# Patient Record
Sex: Female | Born: 2000 | Race: Black or African American | Hispanic: No | Marital: Single | Smoking: Never smoker
Health system: Southern US, Community
[De-identification: ages and names within clinical notes are randomized; demographics above are authoritative.]

## PROBLEM LIST (undated history)

## (undated) HISTORY — PX: HERNIA REPAIR: SHX51

---
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
## 2022-09-03 ENCOUNTER — Encounter (HOSPITAL_BASED_OUTPATIENT_CLINIC_OR_DEPARTMENT_OTHER): Payer: Self-pay

## 2022-09-03 ENCOUNTER — Other Ambulatory Visit: Payer: Self-pay

## 2022-09-03 ENCOUNTER — Emergency Department (HOSPITAL_BASED_OUTPATIENT_CLINIC_OR_DEPARTMENT_OTHER): Payer: BC Managed Care – PPO

## 2022-09-03 ENCOUNTER — Emergency Department (HOSPITAL_BASED_OUTPATIENT_CLINIC_OR_DEPARTMENT_OTHER)
Admission: EM | Admit: 2022-09-03 | Discharge: 2022-09-04 | Disposition: A | Discharge status: Home / Self Care | Payer: BC Managed Care – PPO | Attending: Emergency Medicine | Admitting: Emergency Medicine

## 2022-09-03 DIAGNOSIS — I2510 Atherosclerotic heart disease of native coronary artery without angina pectoris: Secondary | ICD-10-CM | POA: Insufficient documentation

## 2022-09-03 DIAGNOSIS — R079 Chest pain, unspecified: Secondary | ICD-10-CM | POA: Insufficient documentation

## 2022-09-03 LAB — BASIC METABOLIC PANEL
Anion gap: 8 (ref 5–15)
BUN: 12 mg/dL (ref 6–20)
CO2: 22 mmol/L (ref 22–32)
Calcium: 8.8 mg/dL — ABNORMAL LOW (ref 8.9–10.3)
Chloride: 104 mmol/L (ref 98–111)
Creatinine, Ser: 0.88 mg/dL (ref 0.44–1.00)
GFR, Estimated: >60 mL/min (ref >60)
Glucose, Bld: 100 mg/dL — ABNORMAL HIGH (ref 70–99)
Potassium: 3.7 mmol/L (ref 3.5–5.1)
Sodium: 134 mmol/L — ABNORMAL LOW (ref 135–145)

## 2022-09-03 LAB — CBC WITH DIFFERENTIAL/PLATELET
Abs Immature Granulocytes: 0.02 10*3/uL (ref 0.00–0.07)
Basophils Absolute: 0.1 10*3/uL (ref 0.0–0.1)
Basophils Relative: 1 %
Eosinophils Absolute: 0 10*3/uL (ref 0.0–0.5)
Eosinophils Relative: 0 %
HCT: 29.2 % — ABNORMAL LOW (ref 36.0–46.0)
Hemoglobin: 8.5 g/dL — ABNORMAL LOW (ref 12.0–15.0)
Immature Granulocytes: 0 %
Lymphocytes Relative: 48 %
Lymphs Abs: 4 10*3/uL (ref 0.7–4.0)
MCH: 18.5 pg — ABNORMAL LOW (ref 26.0–34.0)
MCHC: 29.1 g/dL — ABNORMAL LOW (ref 30.0–36.0)
MCV: 63.6 fL — ABNORMAL LOW (ref 80.0–100.0)
Monocytes Absolute: 0.5 10*3/uL (ref 0.1–1.0)
Monocytes Relative: 6 %
Neutro Abs: 3.8 10*3/uL (ref 1.7–7.7)
Neutrophils Relative %: 45 %
Platelets: 257 10*3/uL (ref 150–400)
RBC: 4.59 MIL/uL (ref 3.87–5.11)
RDW: 19.7 % — ABNORMAL HIGH (ref 11.5–15.5)
WBC: 8.5 10*3/uL (ref 4.0–10.5)
nRBC: 0 % (ref 0.0–0.2)

## 2022-09-03 LAB — TROPONIN I (HIGH SENSITIVITY): Troponin I (High Sensitivity): <2 ng/L (ref <18)

## 2022-09-03 NOTE — Discharge Instructions (Signed)
It was a pleasure caring for you today in the emergency department.  Please return to the emergency department for any worsening or worrisome symptoms.  Return to the Emergency Department if you have unusual chest pain, pressure, or discomfort, shortness of breath, nausea, vomiting, burping, heartburn, tingling upper body parts, sweating, cold, clammy skin, or racing heartbeat. Call 911 if you think you are having a heart attack.  Follow cardiac diet - avoid fatty & fried foods, don't eat too much red meat, eat lots of fruits & vegetables, and dairy products should be low fat. Please lose weight if you are overweight. Become more active with walking, gardening, or any other activity that gets you to moving.   Please return to the emergency department immediately for any new or concerning symptoms, or if you get worse.  

## 2022-09-03 NOTE — ED Provider Notes (Signed)
[facility] Provider Note  CSN: [id number] Arrival date & time: 09/03/22 2210  Chief Complaint(s) Chest Pain  HPI Kristina Porter is a 22 y.o. female with past medical history as below, significant for no significant medical history who presents to the ED with complaint of chest pain.  Patient reports has been having intermittent chest tightness over the past few months, intermittent paroxysms lasting approximate 2 to 5 minutes.  Unclear provoking factors.  Last episode was yesterday and resolved spontaneously.  She was seen by her pediatrician for this problem in the past and advised to go to the ER if symptoms worsen.  Denies any diet or medication changes, no illicit drug use reported.  No significant dyspnea.  No history of cardiac abnormalities in the past, no family history of sudden cardiac death, no first-degree relatives with MI prior to age 34.  No recent travel or sick contacts.  No leg swelling, no oral hormone use, no history of DVT  Last episode of chest pain around 1900  She is currently asymptomatic   Past Medical History History reviewed. No pertinent past medical history. There are no problems to display for this patient.  Home Medication(s) Prior to Admission medications   Not on File                                                                                                                                    Past Surgical History Past Surgical History:  Procedure Laterality Date   HERNIA REPAIR     Family History History reviewed. No pertinent family history.  Social History Social History   Tobacco Use   Smoking status: Never   Allergies Patient has no known allergies.  Review of Systems Review of Systems  Constitutional:  Negative for chills and fever.  HENT:  Negative for facial swelling and trouble swallowing.   Eyes:  Negative for photophobia and visual disturbance.  Respiratory:  Negative for cough  and shortness of breath.   Cardiovascular:  Positive for chest pain. Negative for palpitations.  Gastrointestinal:  Negative for abdominal pain, nausea and vomiting.  Endocrine: Negative for polydipsia and polyuria.  Genitourinary:  Negative for difficulty urinating and hematuria.  Musculoskeletal:  Negative for gait problem and joint swelling.  Skin:  Negative for pallor and rash.  Neurological:  Negative for syncope and headaches.  Psychiatric/Behavioral:  Negative for agitation and confusion.     Physical Exam Vital Signs  I have reviewed the triage vital signs BP (!) 150/66 (BP Location: Left Arm)   Pulse 100   Temp 99 F (37.2 C)   Resp 20   Ht 5\' 6"  (1.676 m)   Wt 117.9 kg   LMP 08/07/2022 (Approximate)   SpO2 100%   BMI 41.97 kg/m  Physical Exam Vitals and nursing note reviewed.  Constitutional:      General: She is not in acute distress.  Appearance: Normal appearance. She is well-developed. She is obese.  HENT:     Head: Normocephalic and atraumatic.     Right Ear: External ear normal.     Left Ear: External ear normal.     Nose: Nose normal.     Mouth/Throat:     Mouth: Mucous membranes are moist.  Eyes:     General: No scleral icterus.       Right eye: No discharge.        Left eye: No discharge.  Cardiovascular:     Rate and Rhythm: Normal rate and regular rhythm.     Pulses: Normal pulses.     Heart sounds: Normal heart sounds. No murmur heard.    No S3 or S4 sounds.  Pulmonary:     Effort: Pulmonary effort is normal. No respiratory distress.     Breath sounds: Normal breath sounds. No stridor.  Abdominal:     General: Abdomen is flat. There is no distension.     Palpations: Abdomen is soft.     Tenderness: There is no abdominal tenderness.  Musculoskeletal:     Cervical back: No rigidity.     Right lower leg: No edema.     Left lower leg: No edema.  Skin:    General: Skin is warm and dry.     Capillary Refill: Capillary refill takes less than  2 seconds.  Neurological:     Mental Status: She is alert.  Psychiatric:        Mood and Affect: Mood normal.        Behavior: Behavior normal. Behavior is cooperative.     ED Results and Treatments Labs (all labs ordered are listed, but only abnormal results are displayed) Labs Reviewed  CBC WITH DIFFERENTIAL/PLATELET - Abnormal; Notable for the following components:      Result Value   Hemoglobin 8.5 (*)    HCT 29.2 (*)    MCV 63.6 (*)    MCH 18.5 (*)    MCHC 29.1 (*)    RDW 19.7 (*)    All other components within normal limits  BASIC METABOLIC PANEL - Abnormal; Notable for the following components:   Sodium 134 (*)    Glucose, Bld 100 (*)    Calcium 8.8 (*)    All other components within normal limits  URINALYSIS, ROUTINE W REFLEX MICROSCOPIC  PREGNANCY, URINE  TROPONIN I (HIGH SENSITIVITY)  TROPONIN I (HIGH SENSITIVITY)                                                                                                                          Radiology DG Chest 2 View  Result Date: 09/03/2022 CLINICAL DATA:  Chest and upper back pain EXAM: CHEST - 2 VIEW COMPARISON:  None Available. FINDINGS: The heart size and mediastinal contours are within normal limits. Both lungs are clear. The visualized skeletal structures are unremarkable. IMPRESSION: No active cardiopulmonary disease. Electronically Signed   By: Minerva Fester  M.D.   On: 09/03/2022 22:44    Pertinent labs & imaging results that were available during my care of the patient were reviewed by me and considered in my medical decision making (see MDM for details).  Medications Ordered in ED Medications - No data to display                                                                                                                                   Procedures Procedures  (including critical care time)  Medical Decision Making / ED Course    Medical Decision Making:    Kristina Porter is a 22 y.o. female with  past medical history as below, significant for no significant medical history who presents to the ED with complaint of chest pain. The complaint involves an extensive differential diagnosis and also carries with it a high risk of complications and morbidity.  Serious etiology was considered. Ddx includes but is not limited to: Differential includes all life-threatening causes for chest pain. This includes but is not exclusive to acute coronary syndrome, aortic dissection, pulmonary embolism, cardiac tamponade, community-acquired pneumonia, pericarditis, musculoskeletal chest wall pain, etc.   Complete initial physical exam performed, notably the patient  was sitting upright on stretcher, HDS, asymptomatic.    Reviewed and confirmed nursing documentation for past medical history, family history, social history.  Vital signs reviewed.    Clinical Course as of 09/04/22 0000  Mon Sep 03, 2022  2330 Hemoglobin(!): 8.5 Similar to prior [SG]    Clinical Course User Index [SG] Sloan Leiter, DO   PERC negative  Currently asymptomatic  The patient's chest pain is not suggestive of pulmonary embolus, cardiac ischemia, aortic dissection, pericarditis, myocarditis, pulmonary embolism, pneumothorax, pneumonia, Zoster, or esophageal perforation, or other serious etiology.  Historically not abrupt in onset, tearing or ripping, pulses symmetric. EKG nonspecific for ischemia/infarction. No dysrhythmias, brugada, WPW, prolonged QT noted.     Troponin negative. CXR reviewed. Labs without demonstration of acute pathology unless otherwise noted above. Low HEART Score: 0-3 points (0.9-1.7% risk of MACE).  Given the extremely low risk of these diagnoses further testing and evaluation for these possibilities does not appear to be indicated at this time. Patient in no distress and overall condition improved here in the ED. Detailed discussions were had with the patient regarding current findings, and need for close  f/u with PCP or on call doctor. The patient has been instructed to return immediately if the symptoms worsen in any way for re-evaluation. Patient verbalized understanding and is in agreement with current care plan. All questions answered prior to discharge.     Additional history obtained: -Additional history obtained from family -External records from outside source obtained and reviewed including: Chart review including previous notes, labs, imaging, consultation notes including prior labs   Lab Tests: -I ordered, reviewed, and interpreted labs.   The pertinent results include:   Labs Reviewed  CBC  WITH DIFFERENTIAL/PLATELET - Abnormal; Notable for the following components:      Result Value   Hemoglobin 8.5 (*)    HCT 29.2 (*)    MCV 63.6 (*)    MCH 18.5 (*)    MCHC 29.1 (*)    RDW 19.7 (*)    All other components within normal limits  BASIC METABOLIC PANEL - Abnormal; Notable for the following components:   Sodium 134 (*)    Glucose, Bld 100 (*)    Calcium 8.8 (*)    All other components within normal limits  URINALYSIS, ROUTINE W REFLEX MICROSCOPIC  PREGNANCY, URINE  TROPONIN I (HIGH SENSITIVITY)  TROPONIN I (HIGH SENSITIVITY)    Notable for hgb similar to prior  EKG   EKG Interpretation Date/Time:  Monday September 03 2022 22:23:57 EDT Ventricular Rate:  92 PR Interval:  145 QRS Duration:  85 QT Interval:  346 QTC Calculation: 428 R Axis:   50  Text Interpretation: Sinus rhythm Borderline Q waves in lateral leads no prior no stemi Confirmed by Tanda Rockers ([id number]) on 09/03/2022 11:31:44 PM         Imaging Studies ordered: I ordered imaging studies including cxr I independently visualized the following imaging with scope of interpretation limited to determining acute life threatening conditions related to emergency care; findings noted above, significant for stable cxr I independently visualized and interpreted imaging. I agree with the radiologist  interpretation   Medicines ordered and prescription drug management: No orders of the defined types were placed in this encounter.   -I have reviewed the patients home medicines and have made adjustments as needed   Consultations Obtained: na   Cardiac Monitoring: The patient was maintained on a cardiac monitor.  I personally viewed and interpreted the cardiac monitored which showed an underlying rhythm of: NSR  Social Determinants of Health:  Diagnosis or treatment significantly limited by social determinants of health: obesity   Reevaluation: After the interventions noted above, I reevaluated the patient and found that they have improved  Co morbidities that complicate the patient evaluation History reviewed. No pertinent past medical history.    Dispostion: Disposition decision including need for hospitalization was considered, and patient discharged from emergency department.    Final Clinical Impression(s) / ED Diagnoses Final diagnoses:  Acute nonspecific chest pain with low risk of coronary artery disease     This chart was dictated using voice recognition software.  Despite best efforts to proofread,  errors can occur which can change the documentation meaning.

## 2022-09-03 NOTE — ED Notes (Signed)
Patient is pain free at this time. Patient sees a correlation between this pain and what she eats. Between chocolate and pizza, she feels like this pain comes and goes more often than not. States the pain radiates into her back. Happens mostly in her sleep but can also happen during the day.

## 2022-09-03 NOTE — ED Triage Notes (Addendum)
POV from home, A&O x 4, GCS 15, amb to triage  Pt c/o intermittent midsternal chest pain that started a couple months ago, worse over the last two days. Sts normally its when she's laying flat but now it's more when she's moving, endorses nausea/dizziness when its happening. Hx of anemia

## 2022-09-04 DIAGNOSIS — R079 Chest pain, unspecified: Secondary | ICD-10-CM | POA: Diagnosis not present

## 2022-09-04 LAB — URINALYSIS, ROUTINE W REFLEX MICROSCOPIC
Bilirubin Urine: NEGATIVE
Glucose, UA: NEGATIVE mg/dL
Hgb urine dipstick: NEGATIVE
Ketones, ur: NEGATIVE mg/dL
Nitrite: NEGATIVE
Protein, ur: NEGATIVE mg/dL
Specific Gravity, Urine: 1.02 (ref 1.005–1.030)
pH: 5.5 (ref 5.0–8.0)

## 2022-09-04 LAB — URINALYSIS, MICROSCOPIC (REFLEX)

## 2022-09-04 LAB — PREGNANCY, URINE: Preg Test, Ur: NEGATIVE

## 2022-09-04 MED ORDER — ACETAMINOPHEN 325 MG PO TABS: 650.0000 mg | ORAL_TABLET | Freq: Four times a day (QID) | ORAL | 0 refills | Status: AC | PRN

## 2022-09-04 MED ORDER — PANTOPRAZOLE SODIUM 20 MG PO TBEC: 20.0000 mg | DELAYED_RELEASE_TABLET | Freq: Every day | ORAL | 0 refills | Status: AC

## 2022-09-04 MED ORDER — IBUPROFEN 600 MG PO TABS: 600.0000 mg | ORAL_TABLET | Freq: Four times a day (QID) | ORAL | 0 refills | Status: AC | PRN

## 2022-09-04 MED ORDER — CYCLOBENZAPRINE HCL 10 MG PO TABS: 10.0000 mg | ORAL_TABLET | Freq: Two times a day (BID) | ORAL | 0 refills | Status: AC | PRN
